# Patient Record
Sex: Male | Born: 1990 | Race: White | Hispanic: No | Marital: Single | Smoking: Never smoker
Health system: Southern US, Community
[De-identification: ages and names within clinical notes are randomized; demographics above are authoritative.]

## PROBLEM LIST (undated history)

## (undated) DIAGNOSIS — K900 Celiac disease: Secondary | ICD-10-CM

## (undated) HISTORY — DX: Celiac disease: K90.0

## (undated) HISTORY — PX: WISDOM TOOTH EXTRACTION: SHX21

---

## 2019-08-23 ENCOUNTER — Encounter: Payer: Self-pay | Admitting: Internal Medicine

## 2019-09-03 ENCOUNTER — Encounter: Payer: Self-pay | Admitting: Gastroenterology

## 2019-09-03 NOTE — Progress Notes (Signed)
Referring Provider: Elfredia Nevins, MD Primary Care Physician:  Elfredia Nevins, MD Primary Gastroenterologist:  Dr. Jena Gauss  Chief Complaint  Patient presents with  . Abdominal Pain    usually right side, occ left side; on/off since October; mostly after eating  . Nausea    occ    HPI:   Hunter Walters is a 29 y.o. male presenting today at the request of Elfredia Nevins, MD for abdominal pain.   Reviewed outside abdominal ultrasound completed on 04/29/2019: Gallbladder without gallstones or wall thickening.  No Murphy sign noted.  Liver within normal limits.  Pancreas and spleen within normal limits.  Kidneys appear normal.  Today: Intermittent. Usually after a meal.  States there is more of a chance of pain occurring if he has [place], but it also occurs with bland meals at times.  On rare occasion there is no cause of pain. Pain is in RUQ. Started in August 2020. Saw different provider in October for follow-up and was started on fiber, vitamin D, and apple cider vinegar.  Thought this was helping initially but now symptoms are occurring more often and more sharp. When it first started in August, it was dull. Typically occurs once a day.Overall pain isn't going to stop him from doing anyting.  Not associated with movement.  Continues to exercise regularly without trouble.  Pain typically last 30 min to 1 hour. No radiation. Somewhat between ribs. Rarely will have LUQ pain.  Occasional nausea associated with the pain. No vomiting.   Heartburn/acid reflux rarely. No dysphagia. No NSAIDs.  BMs daily. Especially since starting fiber. No constipation. No diarrhea. No blood in the stool. No black stools.  No fever, chills, lightheadedness, dizziness, pre-syncope, or syncope. No chest pain or heart palpitations, SOB, or cough.  No nasal congestion, sore throat, or other cold or flulike symptoms.  History of celiac disease.  Diagnosed at age 61.  Avoiding all gluten.  Celiac disease runs in his  family.  EGD at 29 y.o. to diagnose celiac disease.  No other endoscopic procedures.  Past Medical History:  Diagnosis Date  . Celiac disease     Past Surgical History:  Procedure Laterality Date  . WISDOM TOOTH EXTRACTION      Current Outpatient Medications  Medication Sig Dispense Refill  . APPLE CIDER VINEGAR PO Take by mouth. Takes 1 tablespoon 2-3 times per week    . Calcium Polycarbophil (FIBER-CAPS PO) Take 2 capsules by mouth daily.    . Cholecalciferol (VITAMIN D3) 125 MCG (5000 UT) TABS Take 1 tablet by mouth daily.     No current facility-administered medications for this visit.    Allergies as of 09/05/2019 - Review Complete 09/05/2019  Allergen Reaction Noted  . Gluten meal  09/05/2019  . Sulfa antibiotics  09/05/2019    Family History  Problem Relation Age of Onset  . Colon cancer Neg Hx     Social History   Socioeconomic History  . Marital status: Single    Spouse name: Not on file  . Number of children: Not on file  . Years of education: Not on file  . Highest education level: Not on file  Occupational History  . Not on file  Tobacco Use  . Smoking status: Never Smoker  . Smokeless tobacco: Never Used  Substance and Sexual Activity  . Alcohol use: Yes    Comment: 2-3 drinks once or twice weekly  . Drug use: Not Currently  . Sexual activity: Not on file  Other  Topics Concern  . Not on file  Social History Narrative  . Not on file   Social Determinants of Health   Financial Resource Strain:   . Difficulty of Paying Living Expenses: Not on file  Food Insecurity:   . Worried About Charity fundraiser in the Last Year: Not on file  . Ran Out of Food in the Last Year: Not on file  Transportation Needs:   . Lack of Transportation (Medical): Not on file  . Lack of Transportation (Non-Medical): Not on file  Physical Activity:   . Days of Exercise per Week: Not on file  . Minutes of Exercise per Session: Not on file  Stress:   . Feeling of  Stress : Not on file  Social Connections:   . Frequency of Communication with Friends and Family: Not on file  . Frequency of Social Gatherings with Friends and Family: Not on file  . Attends Religious Services: Not on file  . Active Member of Clubs or Organizations: Not on file  . Attends Archivist Meetings: Not on file  . Marital Status: Not on file  Intimate Partner Violence:   . Fear of Current or Ex-Partner: Not on file  . Emotionally Abused: Not on file  . Physically Abused: Not on file  . Sexually Abused: Not on file    Review of Systems: Gen: See HPI HEENT: See HPI CV: See HPI Resp: See HPI GI: See HPI GU : Denies urinary burning, urinary frequency, urinary hesitancy MS: Denies joint pain Derm: Denies rash Psych: Denies depression or anxiety Heme: Denies bruising or bleeding  Physical Exam: BP (!) 166/67   Pulse (!) 51   Temp (!) 97.1 F (36.2 C) (Oral)   Ht 6\' 2"  (1.88 m)   Wt 212 lb 9.6 oz (96.4 kg)   BMI 27.30 kg/m  General:   Alert and oriented. Pleasant and cooperative. Well-nourished and well-developed.  Head:  Normocephalic and atraumatic. Eyes:  Without icterus, sclera clear and conjunctiva pink.  Ears:  Normal auditory acuity. Lungs:  Clear to auscultation bilaterally. No wheezes, rales, or rhonchi. No distress.  Heart:  S1, S2 present without murmurs appreciated.  Abdomen:  +BS, soft, non-tender and non-distended. No HSM noted. No guarding or rebound. No masses appreciated.  Rectal:  Deferred  Msk:  Symmetrical without gross deformities. Normal posture. Extremities:  Without edema. Neurologic:  Alert and  oriented x4;  grossly normal neurologically. Skin:  Intact without significant lesions or rashes. Psych:  Normal mood and affect.

## 2019-09-05 ENCOUNTER — Encounter: Payer: Self-pay | Admitting: Gastroenterology

## 2019-09-05 ENCOUNTER — Other Ambulatory Visit: Payer: Self-pay

## 2019-09-05 ENCOUNTER — Ambulatory Visit: Payer: BC Managed Care – PPO | Admitting: Gastroenterology

## 2019-09-05 ENCOUNTER — Other Ambulatory Visit: Payer: Self-pay | Admitting: Gastroenterology

## 2019-09-05 ENCOUNTER — Telehealth: Payer: Self-pay | Admitting: *Deleted

## 2019-09-05 DIAGNOSIS — R109 Unspecified abdominal pain: Secondary | ICD-10-CM | POA: Insufficient documentation

## 2019-09-05 DIAGNOSIS — R1031 Right lower quadrant pain: Secondary | ICD-10-CM

## 2019-09-05 NOTE — Patient Instructions (Signed)
I would like to go ahead and update your labs since we do not have any recent labs on file.  Please also complete the HIDA scan.  You should receive a call with the date and time for this procedure.  This is to evaluate the functionality of her gallbladder.  We will call you with your results and further recommendations.  If you have any questions in the meantime, do not hesitate to call.  Ermalinda Memos, PA-C [facility]

## 2019-09-05 NOTE — Assessment & Plan Note (Addendum)
29 year old male with past medical history significant for celiac disease diagnosed at 29 years old following a strict gluten-free diet who presents for intermittent postprandial right upper quadrant pain since August 2020.  Symptoms have become more frequent and pain is more sharp in character but overall does not limit his daily activities.  Last about 30 minutes to 1 hour.  Otherwise, he is without pain.  Occasionally associated with nausea but no vomiting.  Rare reflux symptoms.  No NSAIDs.  No recent labs on file.  Abdominal ultrasound completed on 04/29/2019.  Gallbladder was without gallstones or wall thickening.  No Murphy sign noted.  Liver within normal limits.  Pancreas, spleen, kidneys appear normal.  Abdominal exam is benign today.  Query whether patient may have biliary dyskinesia. We will pursue HIDA scan and update labs including CMP and CBC at this time.  Further recommendations to follow.   Addendum: HIDA within normal limits. Sonographer documented patient reported symptoms with ensure ingestion. Patient states this was discomfort related to sitting for 2 hours during the exam. Nothing similar to his typical symptoms. Started Protonix 40 mg daily, patient was avoiding fried/fatty/spicy foods, and stopped alcohol. 2 week progress report with no significant change in symptoms. Due to ongoing RUQ pain, nausea without vomiting, and history of celiac disease, we will pursue EGD with propofol with Dr. Jena Gauss in the near future. The risks, benefits, and alternatives have been discussed in detail with patient. They have stated understanding and desire to proceed.  Follow-up after procedure.

## 2019-09-05 NOTE — Telephone Encounter (Signed)
LMOVM for pt.   HIDA scheduled for 1/20 at 8:00am, arrival 7:45am, npo midnight, no pain medications after midnight

## 2019-09-05 NOTE — Progress Notes (Signed)
CC'ED TO PCP 

## 2019-09-06 ENCOUNTER — Telehealth: Payer: Self-pay

## 2019-09-06 LAB — CBC WITH DIFFERENTIAL/PLATELET
Basophils Absolute: 0.1 10*3/uL (ref 0.0–0.2)
Basos: 1 %
EOS (ABSOLUTE): 0.2 10*3/uL (ref 0.0–0.4)
Eos: 3 %
Hematocrit: 47.7 % (ref 37.5–51.0)
Hemoglobin: 16.2 g/dL (ref 13.0–17.7)
Immature Grans (Abs): 0 10*3/uL (ref 0.0–0.1)
Immature Granulocytes: 0 %
Lymphocytes Absolute: 2 10*3/uL (ref 0.7–3.1)
Lymphs: 30 %
MCH: 29.3 pg (ref 26.6–33.0)
MCHC: 34 g/dL (ref 31.5–35.7)
MCV: 86 fL (ref 79–97)
Monocytes Absolute: 0.6 10*3/uL (ref 0.1–0.9)
Monocytes: 9 %
Neutrophils Absolute: 3.9 10*3/uL (ref 1.4–7.0)
Neutrophils: 57 %
Platelets: 336 10*3/uL (ref 150–450)
RBC: 5.53 x10E6/uL (ref 4.14–5.80)
RDW: 12.4 % (ref 11.6–15.4)
WBC: 6.7 10*3/uL (ref 3.4–10.8)

## 2019-09-06 LAB — COMPREHENSIVE METABOLIC PANEL
ALT: 18 IU/L (ref 0–44)
AST: 19 IU/L (ref 0–40)
Albumin/Globulin Ratio: 1.8 (ref 1.2–2.2)
Albumin: 4.9 g/dL (ref 4.1–5.2)
Alkaline Phosphatase: 70 IU/L (ref 39–117)
BUN/Creatinine Ratio: 13 (ref 9–20)
BUN: 12 mg/dL (ref 6–20)
Bilirubin Total: 0.4 mg/dL (ref 0.0–1.2)
CO2: 25 mmol/L (ref 20–29)
Calcium: 9.9 mg/dL (ref 8.7–10.2)
Chloride: 101 mmol/L (ref 96–106)
Creatinine, Ser: 0.92 mg/dL (ref 0.76–1.27)
GFR calc Af Amer: 132 mL/min/{1.73_m2} (ref >59)
GFR calc non Af Amer: 114 mL/min/{1.73_m2} (ref >59)
Globulin, Total: 2.7 g/dL (ref 1.5–4.5)
Glucose: 83 mg/dL (ref 65–99)
Potassium: 4.3 mmol/L (ref 3.5–5.2)
Sodium: 140 mmol/L (ref 134–144)
Total Protein: 7.6 g/dL (ref 6.0–8.5)

## 2019-09-06 NOTE — Telephone Encounter (Signed)
Spoke with pt. Pt notified of results.  

## 2019-09-06 NOTE — Telephone Encounter (Signed)
Received and reviewed labs dated 09/06/19. CBC: WBC 6.7, hemoglobin 162, platelets 336 CMP: Glucose 83, creatinine 0.92, sodium 140, potassium 4.3, calcium 9.9, albumin 4.9, bilirubin 0.4, alk phos 70, AST 19, ALT 18.   Will have labs scanned in.   Doris please let patient know his labs were all normal. Will follow-up on HIDA once this is completed.

## 2019-09-06 NOTE — Telephone Encounter (Signed)
Received lab results on CBC and CMP from [facility]. ( Orders were put in for [facility])  No abnormal results.  Placing on desk for review by Ermalinda Memos, PA.

## 2019-09-06 NOTE — Telephone Encounter (Signed)
LMOVM for pt 

## 2019-09-06 NOTE — Telephone Encounter (Signed)
Called pt and left detailed message regarding HIDA scan details.

## 2019-09-06 NOTE — Progress Notes (Signed)
CBC, CMP within normal limits

## 2019-09-14 ENCOUNTER — Other Ambulatory Visit: Payer: Self-pay

## 2019-09-14 ENCOUNTER — Encounter (HOSPITAL_COMMUNITY)
Admission: RE | Admit: 2019-09-14 | Discharge: 2019-09-14 | Disposition: A | Discharge status: Home / Self Care | Payer: BC Managed Care – PPO | Source: Ambulatory Visit | Attending: Gastroenterology | Admitting: Gastroenterology

## 2019-09-14 DIAGNOSIS — R1031 Right lower quadrant pain: Secondary | ICD-10-CM | POA: Diagnosis present

## 2019-09-14 MED ORDER — TECHNETIUM TC 99M MEBROFENIN IV KIT
5.0000 | PACK | Freq: Once | INTRAVENOUS | Status: AC | PRN
  Administered 2019-09-14: 5.34 via INTRAVENOUS

## 2019-09-16 NOTE — Progress Notes (Signed)
Tried to call patient to discuss results.  Gallbladder function is normal.  Report states patient did have mild abdominal pain following ensure ingestion.Helmut Muster, please let patient know results.  Please ask patient if the abdominal pain he had following ensure ingestion is the same RUQ abdominal pain he has been feeling after meals.

## 2019-09-20 NOTE — Progress Notes (Signed)
Noted. As prior US was normal and HIDA essentially normal except for symptoms with ensure ingestion that were not the same as his typical symptoms, I would like to start him on Protonix 40 mg daily, 30 minutes before breakfast, to empirically treat for possible gastritis/duodenitis (inflammation in his stomach or first part of his small intestine). I would like for him to call in 2 weeks with a progress report. If he doesn't have any improvement, would likely need EGD for further evaluation.   He should also continue to avoid all NSAIDs including ibuprofen, aleve, advil, naproxen, and goody powders. He should also stop alcohol consumption for now to see if this helps with his pain.   Please let me know if he is agreeable to starting Protonix and I will send in a prescription.

## 2019-09-21 ENCOUNTER — Other Ambulatory Visit: Payer: Self-pay | Admitting: Gastroenterology

## 2019-09-21 MED ORDER — PANTOPRAZOLE SODIUM 40 MG PO TBEC: 40.0000 mg | DELAYED_RELEASE_TABLET | Freq: Every day | ORAL | 3 refills | Status: DC

## 2019-10-06 ENCOUNTER — Telehealth: Payer: Self-pay | Admitting: Internal Medicine

## 2019-10-06 NOTE — Telephone Encounter (Signed)
PATIENT CALLED AND SAID THAT THE SAMPLES HE WAS GIVEN DID NOT WORK, ABD PAIN DID NOT STOP  [phone]  HE WILL BE AVAILABLE AFTER 3:30 WHEN SCHOOL IS OUT

## 2019-10-06 NOTE — Telephone Encounter (Addendum)
Spoke with pt. Pt is taking the Pantoprazole 40 mg qam 30 mins before eating breakfast. Pt is still having rt side abdominal pain between his rib cage. Pt states that the pain can be a level 1 where you barely notice it or a 5-6. Pain isn't constant or very bothersome per pt. Pt has noticed some nausea after lunch or dinner for the last 2- 3 days. No vomiting to report. Pt is avoiding oils and spicy foods and doesn't feel like the foods he's eating is causing his symptoms. Pt is having regular bowel movements daily without constipation and is taking daily fiber to help stay regular.

## 2019-10-06 NOTE — Telephone Encounter (Signed)
Will call pt after 3:30 PM

## 2019-10-07 NOTE — Telephone Encounter (Signed)
Called pt, EGD w/Prop w/RMR scheduled for 11/28/19 at 10:30am. Orders entered.

## 2019-10-07 NOTE — Telephone Encounter (Signed)
Pre-op and COVID test 11/24/19. Letter mailed with procedure instructions. 

## 2019-10-07 NOTE — Telephone Encounter (Signed)
Noted. Spoke with patient. Continues to have intermittent RUQ pain. Typically within an hour of eating but states he is avoiding fried/fatty foods as well as gluten as he has known celiac disease. Has also eliminated alcohol. Intermittent nausea without vomiting. Discussed reported symptoms with HIDA and patient states this was nothing like the pain he is experiencing. Didn't think it had anything to do with the ensure ingestion. Feels it was more discomfort related to sitting for the exam for 2 hours.   As he has continued to have RUQ pain with known celiac disease, I feel it is best to pursue EGD for further evaluation. Discussed this with patient and he is agreeable to proceed. Discussed risk and benefits as well.   [facility] Clinical Pool: Please arrange EGD with propofol with Dr. Jena Gauss. Dx: RUQ pain, nausea without vomiting, history of celiac disease.

## 2019-10-30 ENCOUNTER — Ambulatory Visit: Payer: BC Managed Care – PPO

## 2019-11-24 ENCOUNTER — Other Ambulatory Visit (HOSPITAL_COMMUNITY)
Admission: RE | Admit: 2019-11-24 | Discharge: 2019-11-24 | Disposition: A | Discharge status: Home / Self Care | Payer: BC Managed Care – PPO | Source: Ambulatory Visit | Attending: Internal Medicine | Admitting: Internal Medicine

## 2019-11-24 ENCOUNTER — Encounter (HOSPITAL_COMMUNITY): Payer: Self-pay

## 2019-11-24 ENCOUNTER — Encounter (HOSPITAL_COMMUNITY)
Admission: RE | Admit: 2019-11-24 | Discharge: 2019-11-24 | Disposition: A | Discharge status: Home / Self Care | Payer: BC Managed Care – PPO | Source: Ambulatory Visit | Attending: Internal Medicine | Admitting: Internal Medicine

## 2019-11-24 ENCOUNTER — Other Ambulatory Visit: Payer: Self-pay

## 2019-11-28 ENCOUNTER — Encounter (HOSPITAL_COMMUNITY)
Admission: RE | Disposition: A | Discharge status: Home / Self Care | Payer: Self-pay | Source: Home / Self Care | Attending: Internal Medicine

## 2019-11-28 ENCOUNTER — Ambulatory Visit (HOSPITAL_COMMUNITY)
Admission: RE | Admit: 2019-11-28 | Discharge: 2019-11-28 | Disposition: A | Discharge status: Home / Self Care | Payer: BC Managed Care – PPO | Attending: Internal Medicine | Admitting: Internal Medicine

## 2019-11-28 ENCOUNTER — Encounter (HOSPITAL_COMMUNITY): Payer: Self-pay | Admitting: Internal Medicine

## 2019-11-28 ENCOUNTER — Ambulatory Visit (HOSPITAL_COMMUNITY): Payer: BC Managed Care – PPO | Admitting: Anesthesiology

## 2019-11-28 DIAGNOSIS — Z8719 Personal history of other diseases of the digestive system: Secondary | ICD-10-CM | POA: Diagnosis not present

## 2019-11-28 DIAGNOSIS — Z79899 Other long term (current) drug therapy: Secondary | ICD-10-CM | POA: Diagnosis not present

## 2019-11-28 DIAGNOSIS — R1011 Right upper quadrant pain: Secondary | ICD-10-CM | POA: Insufficient documentation

## 2019-11-28 DIAGNOSIS — K3189 Other diseases of stomach and duodenum: Secondary | ICD-10-CM | POA: Insufficient documentation

## 2019-11-28 HISTORY — PX: ESOPHAGOGASTRODUODENOSCOPY (EGD) WITH PROPOFOL: SHX5813

## 2019-11-28 HISTORY — PX: BIOPSY: SHX5522

## 2019-11-28 SURGERY — ESOPHAGOGASTRODUODENOSCOPY (EGD) WITH PROPOFOL
Anesthesia: General

## 2019-11-28 MED ORDER — PROPOFOL 10 MG/ML IV BOLUS
INTRAVENOUS | Status: DC | PRN
  Administered 2019-11-28 (×2): 40 ug via INTRAVENOUS

## 2019-11-28 MED ORDER — CHLORHEXIDINE GLUCONATE CLOTH 2 % EX PADS: 6.0000 | MEDICATED_PAD | Freq: Once | CUTANEOUS | Status: DC

## 2019-11-28 MED ORDER — PROPOFOL 500 MG/50ML IV EMUL
INTRAVENOUS | Status: DC | PRN
  Administered 2019-11-28: 150 ug/kg/min via INTRAVENOUS

## 2019-11-28 MED ORDER — STERILE WATER FOR IRRIGATION IR SOLN
Status: DC | PRN
  Administered 2019-11-28: 100 mL

## 2019-11-28 MED ORDER — MIDAZOLAM HCL 2 MG/2ML IJ SOLN
INTRAMUSCULAR | Status: DC | PRN
  Administered 2019-11-28: 2 mg via INTRAVENOUS

## 2019-11-28 MED ORDER — MIDAZOLAM HCL 2 MG/2ML IJ SOLN
INTRAMUSCULAR | Status: AC
  Filled 2019-11-28: qty 2

## 2019-11-28 MED ORDER — PROPOFOL 10 MG/ML IV BOLUS
INTRAVENOUS | Status: AC
  Filled 2019-11-28: qty 40

## 2019-11-28 MED ORDER — LACTATED RINGERS IV SOLN: Freq: Once | INTRAVENOUS | Status: AC

## 2019-11-28 MED ORDER — LACTATED RINGERS IV SOLN: INTRAVENOUS | Status: DC | PRN

## 2019-11-28 NOTE — Discharge Instructions (Signed)
EGD Discharge instructions Please read the instructions outlined below and refer to this sheet in the next few weeks. These discharge instructions provide you with general information on caring for yourself after you leave the hospital. Your doctor may also give you specific instructions. While your treatment has been planned according to the most current medical practices available, unavoidable complications occasionally occur. If you have any problems or questions after discharge, please call your doctor. ACTIVITY  You may resume your regular activity but move at a slower pace for the next 24 hours.   Take frequent rest periods for the next 24 hours.   Walking will help expel (get rid of) the air and reduce the bloated feeling in your abdomen.   No driving for 24 hours (because of the anesthesia (medicine) used during the test).   You may shower.   Do not sign any important legal documents or operate any machinery for 24 hours (because of the anesthesia used during the test).  NUTRITION  Drink plenty of fluids.   You may resume your normal diet.   Begin with a light meal and progress to your normal diet.   Avoid alcoholic beverages for 24 hours or as instructed by your caregiver.  MEDICATIONS  You may resume your normal medications unless your caregiver tells you otherwise.  WHAT YOU CAN EXPECT TODAY  You may experience abdominal discomfort such as a feeling of fullness or "gas" pains.  FOLLOW-UP  Your doctor will discuss the results of your test with you.  SEEK IMMEDIATE MEDICAL ATTENTION IF ANY OF THE FOLLOWING OCCUR:  Excessive nausea (feeling sick to your stomach) and/or vomiting.   Severe abdominal pain and distention (swelling).   Trouble swallowing.   Temperature over 101 F (37.8 C).   Rectal bleeding or vomiting of blood.    Small bowel appeared abnormal.  Not unexpected with celiac disease.  Biopsies were performed.  Your stomach and esophagus appeared  normal  Further recommendations to follow pending review of pathology report  At patient request, I called Shanda Bumps at [phone] and reviewed results

## 2019-11-28 NOTE — H&P (Signed)
@LOGO @   Primary Care Physician:  , MD Primary Gastroenterologist:  Dr. Elfredia Nevins  Pre-Procedure History & Physical: HPI:  Hunter Walters is a 29 y.o. male here for further evaluation of right upper quadrant pain.  No difference with Protonix 40 mg daily no dysphagia.  For diagnostic EGD.  History of celiac disease.  Past Medical History:  Diagnosis Date  . Celiac disease     Past Surgical History:  Procedure Laterality Date  . WISDOM TOOTH EXTRACTION      Prior to Admission medications   Medication Sig Start Date End Date Taking? Authorizing Provider  Calcium Polycarbophil (FIBER-CAPS PO) Take 2 capsules by mouth daily.   Yes [provider]  Cholecalciferol (VITAMIN D3) 125 MCG (5000 UT) TABS Take 5,000 Units by mouth daily.    Yes [provider]  pantoprazole (PROTONIX) 40 MG tablet Take 1 tablet (40 mg total) by mouth daily before breakfast. 09/21/19 09/20/20 Yes 09/22/20, PA-C    Allergies as of 10/07/2019 - Review Complete 09/05/2019  Allergen Reaction Noted  . Gluten meal  09/05/2019  . Sulfa antibiotics  09/05/2019    Family History  Problem Relation Age of Onset  . Colon cancer Neg Hx     Social History   Socioeconomic History  . Marital status: Single    Spouse name: Not on file  . Number of children: Not on file  . Years of education: Not on file  . Highest education level: Not on file  Occupational History  . Not on file  Tobacco Use  . Smoking status: Never Smoker  . Smokeless tobacco: Never Used  Substance and Sexual Activity  . Alcohol use: Yes    Comment: 2-3 drinks once or twice weekly  . Drug use: Not Currently  . Sexual activity: Not on file  Other Topics Concern  . Not on file  Social History Narrative  . Not on file   Social Determinants of Health   Financial Resource Strain:   . Difficulty of Paying Living Expenses:   Food Insecurity:   . Worried About 11/03/2019 in the Last Year:   .  Programme researcher, broadcasting/film/video in the Last Year:   Transportation Needs:   . Barista (Medical):   Freight forwarder Lack of Transportation (Non-Medical):   Physical Activity:   . Days of Exercise per Week:   . Minutes of Exercise per Session:   Stress:   . Feeling of Stress :   Social Connections:   . Frequency of Communication with Friends and Family:   . Frequency of Social Gatherings with Friends and Family:   . Attends Religious Services:   . Active Member of Clubs or Organizations:   . Attends Marland Kitchen Meetings:   Banker Marital Status:   Intimate Partner Violence:   . Fear of Current or Ex-Partner:   . Emotionally Abused:   [employer]land [employer] Physically Abused:   . Sexually Abused:     Review of Systems: See HPI, otherwise negative ROS  Physical Exam: BP 127/78   Pulse 70   Temp 98 F (36.7 C) (Oral)   Resp 20   Ht 6\' 3"  (1.905 m)   Wt 88.5 kg   SpO2 99%   BMI 24.37 kg/m  General:   Alert,  Well-developed, well-nourished, pleasant and cooperative in NAD  Mouth:  No deformity or lesions. Neck:  Supple; no masses or thyromegaly. No significant cervical adenopathy. Lungs:  Clear throughout to auscultation.  No wheezes, crackles, or rhonchi. No acute distress. Heart:  Regular rate and rhythm; no murmurs, clicks, rubs,  or gallops. Abdomen: Non-distended, normal bowel sounds.  Soft and nontender without appreciable mass or hepatosplenomegaly.  Pulses:  Normal pulses noted. Extremities:  Without clubbing or edema.  Impression/Plan:  29 year old male with celiac disease right upper quadrant abdominal pain.  Gallbladder work-up negative.  No dysphagia.  Here for diagnostic EGD.  The risks, benefits, limitations, alternatives and imponderables have been reviewed with the patient. Potential for esophageal dilation, biopsy, etc. have also been reviewed.  Questions have been answered. All parties agreeable.     Notice: This dictation was prepared with Dragon dictation along with smaller phrase  technology. Any transcriptional errors that result from this process are unintentional and may not be corrected upon review.

## 2019-11-28 NOTE — Op Note (Signed)
[facility] Patient Name: Hunter Walters Procedure Date: 11/28/2019 10:52 AM MRN: [id number] Date of Birth: [birth year] Attending MD: Norvel Richards , MD CSN: [id number] Age: 29 Admit Type: Outpatient Procedure:                Upper GI endoscopy Indications:              Abdominal pain in the right upper quadrant Providers:                Norvel Richards, MD, Rosina Lowenstein, RN, Raphael Gibney, Technician Referring MD:              Medicines:                Propofol per Anesthesia Complications:            No immediate complications. Estimated Blood Loss:     Estimated blood loss was minimal. Procedure:                Pre-Anesthesia Assessment:                           - Prior to the procedure, a History and Physical                            was performed, and patient medications and                            allergies were reviewed. The patient's tolerance of                            previous anesthesia was also reviewed. The risks                            and benefits of the procedure and the sedation                            options and risks were discussed with the patient.                            All questions were answered, and informed consent                            was obtained. Prior Anticoagulants: The patient has                            taken no previous anticoagulant or antiplatelet                            agents. ASA Grade Assessment: II - A patient with                            mild systemic disease. After reviewing the risks  and benefits, the patient was deemed in                            satisfactory condition to undergo the procedure.                           After obtaining informed consent, the endoscope was                            passed under direct vision. Throughout the                            procedure, the patient's blood pressure, pulse, and       oxygen saturations were monitored continuously. The                            [id number]-[id number] ([id number]) scope was introduced through the                            mouth, and advanced to the second part of duodenum.                            The upper GI endoscopy was accomplished without                            difficulty. The patient tolerated the procedure                            well. Scope In: 11:05:37 AM Scope Out: 11:11:23 AM Total Procedure Duration: 0 hours 5 minutes 46 seconds  Findings:      The examined esophagus was normal.      The entire examined stomach was normal.      Abnormal appearing bulb second and third portion of the duodenum with       denuded appearing mucosa. No scalloping seen. No ulcer. No mass.       Multiple biopsies taken Impression:               - Normal esophagus.                           - Normal stomach.                           - Mucosal changes in the duodenum. Abnormal                            appearing mucosa of uncertain significance?"in the                            setting of celiac disease?"status post biopsy                           Further recommendations to follow pending review of  pathology report Moderate Sedation:      Moderate (conscious) sedation was personally administered by an       anesthesia professional. The following parameters were monitored: oxygen       saturation, heart rate, blood pressure, respiratory rate, EKG, adequacy       of pulmonary ventilation, and response to care. Recommendation:            Procedure Code(s):        --- Professional ---                           [phone], Esophagogastroduodenoscopy, flexible,                            transoral; diagnostic, including collection of                            specimen(s) by brushing or washing, when performed                            (separate procedure) Diagnosis Code(s):        --- Professional ---                            K31.89, Other diseases of stomach and duodenum                           R10.11, Right upper quadrant pain CPT copyright 2019 American Medical Association. All rights reserved. The codes documented in this report are preliminary and upon coder review may  be revised to meet current compliance requirements. Gerrit Friends. Dawon Troop, MD Gennette Pac, MD 11/28/2019 11:20:17 AM This report has been signed electronically. Number of Addenda: 0

## 2019-11-28 NOTE — Transfer of Care (Signed)
Immediate Anesthesia Transfer of Care Note  Patient: Hunter Walters [place]  Procedure(s) Performed: ESOPHAGOGASTRODUODENOSCOPY (EGD) WITH PROPOFOL (N/A ) BIOPSY  Patient Location: PACU  Anesthesia Type:General  Level of Consciousness: awake  Airway & Oxygen Therapy: Patient Spontanous Breathing  Post-op Assessment: Report given to RN  Post vital signs: Reviewed and stable  Last Vitals:  Vitals Value Taken Time  BP    Temp    Pulse    Resp    SpO2      Last Pain:  Vitals:   11/28/19 1114  TempSrc:   PainSc: 0-No pain         Complications: No apparent anesthesia complications

## 2019-11-28 NOTE — Anesthesia Postprocedure Evaluation (Signed)
Anesthesia Post Note  Patient: Hunter Walters  Procedure(s) Performed: ESOPHAGOGASTRODUODENOSCOPY (EGD) WITH PROPOFOL (N/A ) BIOPSY  Patient location during evaluation: PACU Anesthesia Type: General Level of consciousness: awake and alert and oriented Pain management: pain level controlled Vital Signs Assessment: post-procedure vital signs reviewed and stable Respiratory status: spontaneous breathing Cardiovascular status: blood pressure returned to baseline and stable Postop Assessment: no apparent nausea or vomiting Anesthetic complications: no     Last Vitals:  Vitals:   11/28/19 0938  BP: 127/78  Pulse: 70  Resp: 20  Temp: 36.7 C  SpO2: 99%    Last Pain:  Vitals:   11/28/19 1114  TempSrc:   PainSc: 0-No pain                 Shoni Quijas

## 2019-11-28 NOTE — Anesthesia Preprocedure Evaluation (Signed)
Anesthesia Evaluation  Patient identified by MRN, date of birth, ID band Patient awake    Reviewed: Allergy & Precautions, NPO status , Patient's Chart, lab work & pertinent test results  Airway Mallampati: I  TM Distance: >3 FB Neck ROM: Full    Dental  (+) Dental Advisory Given, Teeth Intact   Pulmonary neg pulmonary ROS,    Pulmonary exam normal breath sounds clear to auscultation       Cardiovascular Exercise Tolerance: Good  Rhythm:Regular Rate:Normal     Neuro/Psych negative neurological ROS  negative psych ROS   GI/Hepatic (+)     substance abuse  alcohol use, Celiac disease   Endo/Other  negative endocrine ROS  Renal/GU negative Renal ROS  negative genitourinary   Musculoskeletal negative musculoskeletal ROS (+)   Abdominal   Peds negative pediatric ROS (+)  Hematology negative hematology ROS (+)   Anesthesia Other Findings   Reproductive/Obstetrics negative OB ROS                             Anesthesia Physical Anesthesia Plan  ASA: I  Anesthesia Plan: General   Post-op Pain Management:    Induction: Intravenous  PONV Risk Score and Plan: 2 and Treatment may vary due to age or medical condition  Airway Management Planned: Nasal Cannula, Natural Airway and Simple Face Mask  Additional Equipment:   Intra-op Plan:   Post-operative Plan:   Informed Consent: I have reviewed the patients History and Physical, chart, labs and discussed the procedure including the risks, benefits and alternatives for the proposed anesthesia with the patient or authorized representative who has indicated his/her understanding and acceptance.     Dental advisory given  Plan Discussed with: CRNA and Surgeon  Anesthesia Plan Comments:         Anesthesia Quick Evaluation

## 2019-11-29 LAB — SURGICAL PATHOLOGY

## 2019-12-03 ENCOUNTER — Encounter: Payer: Self-pay | Admitting: Internal Medicine

## 2019-12-05 ENCOUNTER — Telehealth: Payer: Self-pay

## 2019-12-05 DIAGNOSIS — R1011 Right upper quadrant pain: Secondary | ICD-10-CM

## 2019-12-05 NOTE — Telephone Encounter (Signed)
OV made and appt card mailed °

## 2019-12-05 NOTE — Telephone Encounter (Signed)
Agree with evaluating his small bowel. Dr. Jena Gauss, would you recommend given capsule or CT enterography?

## 2019-12-05 NOTE — Telephone Encounter (Signed)
Per RMR- Send letter to patient.  Send copy of letter with path to referring provider and PCP.   Need ov with [facility] in 3-4 weeks.   Does he need imaging of his entire small bowel to r/o complications of long standing celiac disease (tumor / Lymphoma ) as a cause of pain before circling back to possible GB etiology??

## 2019-12-06 NOTE — Telephone Encounter (Signed)
If not responded to previously, yes, Would Be Best If [employer] along with It.

## 2019-12-07 NOTE — Telephone Encounter (Signed)
Anyway for [place] to find out if insurance will cover CT enterography or [employer] and which would be covered better? Dx: history of celiac disease, recurrent RUQ pain without significant findings on EGD, r/o possible tumor/lymphoma.

## 2019-12-08 NOTE — Telephone Encounter (Signed)
Lmom, waiting on a return call.  

## 2019-12-08 NOTE — Telephone Encounter (Signed)
Noted. Please let patient know as his EGD was without any significant findings, we would like to evaluate his small bowel completely with a CT enterography to exclude any other causes of his RUQ pain if insurance will pay for it. If he is agreeable, please pursue PA.

## 2019-12-08 NOTE — Telephone Encounter (Signed)
Letter mailed to pt.  

## 2019-12-08 NOTE — Telephone Encounter (Signed)
A PA would have to be submitted to see if it would be approved for both procedures via patient insurance.

## 2019-12-09 NOTE — Telephone Encounter (Signed)
Pt returned call and is in agreement with the CT enterography if insurance will cover it.

## 2019-12-09 NOTE — Telephone Encounter (Signed)
Noted. [facility] Clinical pool, please proceed with PA for  CT Enterography.

## 2019-12-12 NOTE — Addendum Note (Signed)
Addended by: Armstead Peaks on: 12/12/2019 08:21 AM   Modules accepted: Orders

## 2019-12-12 NOTE — Telephone Encounter (Addendum)
PA approved for CT Entero. Auth# [id number] dates 12/12/2019-06/08/2020 via [employer].  CT entero scheduled for 12/29/19 at 3:00pm, arrival 2:00pm to start drinking contrast at radiology,  Npo 4 hours prior. LMOVM for pt. Letter mailed with appt information.

## 2019-12-22 ENCOUNTER — Other Ambulatory Visit: Payer: Self-pay | Admitting: Gastroenterology

## 2019-12-26 ENCOUNTER — Ambulatory Visit: Payer: BC Managed Care – PPO | Admitting: Gastroenterology

## 2019-12-29 ENCOUNTER — Ambulatory Visit (HOSPITAL_COMMUNITY): Payer: BC Managed Care – PPO

## 2020-01-13 ENCOUNTER — Ambulatory Visit: Payer: BC Managed Care – PPO | Attending: Internal Medicine

## 2020-01-13 DIAGNOSIS — Z23 Encounter for immunization: Secondary | ICD-10-CM

## 2020-01-13 NOTE — Progress Notes (Signed)
   Covid-19 Vaccination Clinic  Name:  Anddy Wingert    MRN: [id number] DOB: February 17, 1991  01/13/2020  Mr. Pinedo was observed post Covid-19 immunization for 15 minutes without incident. He was provided with Vaccine Information Sheet and instruction to access the V-Safe system.   Mr. Busche was instructed to call 911 with any severe reactions post vaccine: Marland Kitchen Difficulty breathing  . Swelling of face and throat  . A fast heartbeat  . A bad rash all over body  . Dizziness and weakness   Immunizations Administered    Name Date Dose VIS Date Route   Pfizer COVID-19 Vaccine 01/13/2020  3:38 PM 0.3 mL 10/19/2018 Intramuscular   Manufacturer: [employer], [employer]   Lot: [id number]   NDC: [address]-1

## 2020-02-01 ENCOUNTER — Other Ambulatory Visit: Payer: Self-pay

## 2020-02-01 ENCOUNTER — Ambulatory Visit (HOSPITAL_COMMUNITY)
Admission: RE | Admit: 2020-02-01 | Discharge: 2020-02-01 | Disposition: A | Discharge status: Home / Self Care | Payer: BC Managed Care – PPO | Source: Ambulatory Visit | Attending: Gastroenterology | Admitting: Gastroenterology

## 2020-02-01 DIAGNOSIS — R1011 Right upper quadrant pain: Secondary | ICD-10-CM | POA: Diagnosis not present

## 2020-02-01 MED ORDER — IOHEXOL 300 MG/ML  SOLN
150.0000 mL | Freq: Once | INTRAMUSCULAR | Status: AC | PRN
  Administered 2020-02-01: 125 mL via INTRAVENOUS

## 2020-02-03 ENCOUNTER — Ambulatory Visit: Payer: BC Managed Care – PPO | Attending: Internal Medicine

## 2020-02-03 ENCOUNTER — Ambulatory Visit: Payer: BC Managed Care – PPO

## 2020-02-03 DIAGNOSIS — Z23 Encounter for immunization: Secondary | ICD-10-CM

## 2020-02-03 NOTE — Progress Notes (Signed)
   Covid-19 Vaccination Clinic  Name:  Hunter Walters    MRN: [id number] DOB: 1991-05-03  02/03/2020  Hunter Walters was observed post Covid-19 immunization for 15 minutes without incident. He was provided with Vaccine Information Sheet and instruction to access the V-Safe system.   Hunter Walters was instructed to call 911 with any severe reactions post vaccine: Marland Kitchen Difficulty breathing  . Swelling of face and throat  . A fast heartbeat  . A bad rash all over body  . Dizziness and weakness   Immunizations Administered    Name Date Dose VIS Date Route   Pfizer COVID-19 Vaccine 02/03/2020 10:13 AM 0.3 mL 10/19/2018 Intramuscular   Manufacturer: [employer], [employer]   Lot: [id number]   NDC: [address]-1

## 2020-02-04 NOTE — Progress Notes (Signed)
Referring Provider: Redmond School, MD Primary Care Physician:  Redmond School, MD Primary GI Physician: Dr. Gala Romney  Chief Complaint  Patient presents with  . Follow-up    fu RUQ abd pain, seems better with wt loss, hx Covid 10/08/19    HPI:   Hunter Walters is a 29 y.o. male with history of celiac disease presenting today for follow-up of RUQ abdominal pain. Last seen in our office for the same in January 2021. He reported intermittent postprandial RUQ pain since August 2020 that had become more frequent and sharp lasting 30 minutes - 1 hour. Occasional nausea without vomiting. Rare reflux symptoms. No NSAIDs. Abdominal ultrasound in September 2020 without significant findings. His abdominal exam was benign. He was scheduled for HIDA scan and labs were updated.  CBC and CMP within normal limits. HIDA scan was within normal limits. Sonographer documented patient had symptoms with Ensure ingestion. I spoke with patient and he states this was discomfort related to sitting for 2 hours during the exam. Nothing similar to his typical symptoms. We started Protonix 40 mg daily and patient was avoiding fried/fatty/spicy foods and had stopped alcohol. 2-week progress report with no significant change and he was scheduled for EGD.  EGD April 2021 with normal esophagus, normal stomach, abnormal-appearing duodenum s/p multiple biopsies. Pathology with minimal increase in intraepithelial lymphocytes in the villous tips, no significant villous blunting, no dysplasia or malignancy. He was scheduled for CTE for complete evaluation of his small bowel to rule out any complication secondary to long-standing celiac disease.  CTE 02/01/20 with no abnormalities. Notified patient of results. He reported he had lost 20 pounds. He was wrestling at a local school and stated he has very little pain. States he has been very active and not having any problems. No longer needing Zofran.  Today:  Abdominal pain is much more  subtle. Pain had started to resolve prior to EGD. It is very light and will be gone in 5 minutes. No longer having sharp pain. Not sure if it is just cramps. May go 2 months without pain. Lost about 20 lbs since he has been wrestling. At his goal weight. Not taking Protonix. No GERD symptoms or dysphagia. No nausea or vomiting. BMs daily. No blood in the stool or black stool. Had [place] in February. Has just completed the vaccine series last Friday.   Will take ibuprofen as needed. Not regularly. Alcohol maybe 1-2 times a week. No change in symptoms since resuming occasional alcohol.   Past Medical History:  Diagnosis Date  . Celiac disease    diagnosed at age 79    Past Surgical History:  Procedure Laterality Date  . BIOPSY  11/28/2019   Procedure: BIOPSY;  Surgeon: Daneil Dolin, MD;  Location: [facility];  Service: Endoscopy;;  . ESOPHAGOGASTRODUODENOSCOPY (EGD) WITH PROPOFOL N/A 11/28/2019   Procedure: ESOPHAGOGASTRODUODENOSCOPY (EGD) WITH PROPOFOL;  Surgeon: Daneil Dolin, MD; normal esophagus, normal stomach, abnormal-appearing duodenum s/p multiple biopsies. Pathology with minimal increase in intraepithelial lymphocytes in the villous tips, no significant villous blunting, no dysplasia or malignancy.  . WISDOM TOOTH EXTRACTION      No current outpatient medications on file.   No current facility-administered medications for this visit.    Allergies as of 02/06/2020 - Review Complete 02/06/2020  Allergen Reaction Noted  . Gluten meal  09/05/2019  . Sulfa antibiotics  09/05/2019    Family History  Problem Relation Age of Onset  . Colon cancer Neg Hx  Social History   Socioeconomic History  . Marital status: Single    Spouse name: Not on file  . Number of children: Not on file  . Years of education: Not on file  . Highest education level: Not on file  Occupational History  . Not on file  Tobacco Use  . Smoking status: Never Smoker  . Smokeless tobacco: Never  Used  Substance and Sexual Activity  . Alcohol use: Yes    Comment: 2-3 drinks once or twice weekly  . Drug use: Not Currently  . Sexual activity: Not on file  Other Topics Concern  . Not on file  Social History Narrative  . Not on file   Social Determinants of Health   Financial Resource Strain:   . Difficulty of Paying Living Expenses:   Food Insecurity:   . Worried About Programme researcher, broadcasting/film/video in the Last Year:   . Barista in the Last Year:   Transportation Needs:   . Freight forwarder (Medical):   Marland Kitchen Lack of Transportation (Non-Medical):   Physical Activity:   . Days of Exercise per Week:   . Minutes of Exercise per Session:   Stress:   . Feeling of Stress :   Social Connections:   . Frequency of Communication with Friends and Family:   . Frequency of Social Gatherings with Friends and Family:   . Attends Religious Services:   . Active Member of Clubs or Organizations:   . Attends Banker Meetings:   Marland Kitchen Marital Status:     Review of Systems: Gen: Denies fever, chills, cold or flulike symptoms, lightheadedness, dizziness, presyncope, syncope CV: Denies chest pain or palpitations Resp: Denies dyspnea or cough GI: See HPI Derm: Denies rash Heme: See HPI  Physical Exam: BP (!) 143/72   Pulse 83   Temp (!) 96.8 F (36 C) (Temporal)   Ht 6\' 3"  (1.905 m)   Wt 189 lb 12.8 oz (86.1 kg)   BMI 23.72 kg/m  General:   Alert and oriented. No distress noted. Pleasant and cooperative.  Head:  Normocephalic and atraumatic. Eyes:  Conjuctiva clear without scleral icterus. Heart:  S1, S2 present without murmurs appreciated. Lungs:  Clear to auscultation bilaterally. No wheezes, rales, or rhonchi. No distress.  Abdomen:  +BS, soft, non-tender and non-distended. No rebound or guarding. No HSM or masses noted. Msk:  Symmetrical without gross deformities. Normal posture. Extremities:  Without edema. Neurologic:  Alert and  oriented x4 Psych:  Normal mood  and affect.

## 2020-02-06 ENCOUNTER — Other Ambulatory Visit: Payer: Self-pay

## 2020-02-06 ENCOUNTER — Ambulatory Visit: Payer: BC Managed Care – PPO | Admitting: Gastroenterology

## 2020-02-06 ENCOUNTER — Encounter: Payer: Self-pay | Admitting: Gastroenterology

## 2020-02-06 VITALS — BP 143/72 | HR 83 | Temp 96.8°F | Ht 75.0 in | Wt 189.8 lb

## 2020-02-06 DIAGNOSIS — R1011 Right upper quadrant pain: Secondary | ICD-10-CM | POA: Diagnosis not present

## 2020-02-06 NOTE — Assessment & Plan Note (Addendum)
29 year old male with medical history significant for celiac disease diagnosed at 29 years of age following a strict gluten-free diet who reported new onset intermittent postprandial right upper quadrant pain starting back in August 2020 that had become more frequent/sharp when I saw him in January 2021. Work-up initially included CBC and CMP within normal limits, HIDA scan within normal limits.  I started him on Protonix 40 mg daily empirically for possible gastritis.  He had no improvement or change in symptoms in 2 weeks so EGD was pursued.  EGD in April 2021 with normal esophagus, normal stomach, abnormal appearing duodenum s/p multiple biopsies.  Pathology with minimal increase in intraepithelial lymphocytes in the villous tips, no villous blunting.  He was scheduled for CT for complete evaluation/rule out complication secondary to longstanding celiac disease which was also normal.  At this time, abdominal pain has significantly improved/almost resolved.  Intentional weight loss of about 20 pounds since he has began wrestling and is now at his goal weight.  No longer taking Protonix and has not noticed any worsening since discontinuing this medication.  No other significant GI symptoms.  No BRBPR or melena.  Reports taking ibuprofen as needed.  Has resumed alcohol maybe 1-2 times a week.  Abdominal exam is benign.  It is possible patient had a mild gastritis/duodenitis that had improved/resolved by the time of EGD.  As symptoms have significantly improved and he has discontinued Protonix on his own, I am okay with him continuing to stay off of this.  Discussed the fact that NSAIDs and alcohol can be triggers for gastritis/duodenitis, and he was advised to limit these products.  We will plan to follow-up as needed.  Advised patient to call with any return of symptoms or other GI concerns.

## 2020-02-06 NOTE — Patient Instructions (Signed)
I am glad you are feeling much better!  I suspect that you may have had some mild inflammation in your stomach or first part of your small intestine that may have been contributing to your right-sided abdominal pain.  As your symptoms have significantly improved/almost resolved, I am okay with you holding Protonix.  Be mindful that alcohol and NSAID products including ibuprofen, Aleve, Advil, and Goody powders can contribute/cause gastritis (inflammation in your stomach). Continue to limit these products.   We will plan to see back as needed.  Do not hesitate to call if you have any return of abdominal pain or other GI concerns!  Ermalinda Memos, PA-C [facility]

## 2020-02-06 NOTE — Progress Notes (Signed)
Cc'ed to pcp °

## 2020-06-19 IMAGING — NM NM HEPATO W/GB/PHARM/[PERSON_NAME]
2 series · 12 of 12 positions shown · non-contrast
Comparison: None.

CLINICAL DATA: Postprandial RIGHT upper quadrant pain and nausea
for months, RIGHT lower quadrant pain

EXAM:
NUCLEAR MEDICINE HEPATOBILIARY IMAGING WITH GALLBLADDER EF
TECHNIQUE: Sequential images of the abdomen were obtained [DATE] minutes
following intravenous administration of radiopharmaceutical. After
oral ingestion of Ensure, gallbladder ejection fraction was
determined. At 60 min, normal ejection fraction is greater than 33%.
RADIOPHARMACEUTICALS:  5.34 mCi 2c-QQm  Choletec IV

[Series 1: biliary · 3.25mm/px · 6 of 60 frames shown]
[frame 6/60]
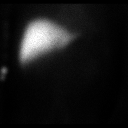
[frame 16/60]
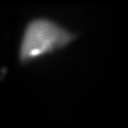
[frame 26/60]
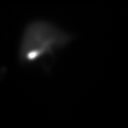
[frame 36/60]
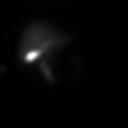
[frame 46/60]
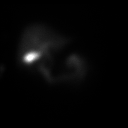
[frame 56/60]
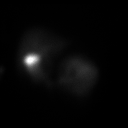

[Series 2: gbef · 3.25mm/px · 6 of 60 frames shown]
[frame 6/60]
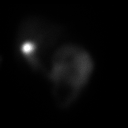
[frame 16/60]
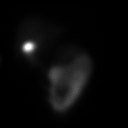
[frame 26/60]
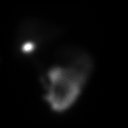
[frame 36/60]
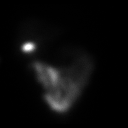
[frame 46/60]
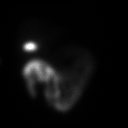
[frame 56/60]
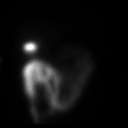

[12 of 12 positions shown; findings below may reference images not displayed]

FINDINGS: Normal tracer extraction from bloodstream indicating normal
hepatocellular function.

Normal excretion of tracer into biliary tree.

Gallbladder visualized at 14 min.

Small bowel visualized at 29 min.

No hepatic retention of tracer.

Subjectively normal emptying of tracer from gallbladder following
fatty meal stimulation.

Calculated gallbladder ejection fraction is 65%, normal.

Patient reported mild abdominal pain following Ensure ingestion.

Normal gallbladder ejection fraction following Ensure ingestion is
greater than 33% at 1 hour.
IMPRESSION: Patent biliary tree with normal gallbladder ejection fraction of 65%
following fatty meal stimulation.

Patient reported mild abdominal pain following Ensure ingestion.

## 2020-08-13 ENCOUNTER — Ambulatory Visit: Payer: BC Managed Care – PPO | Attending: Internal Medicine

## 2020-08-13 DIAGNOSIS — Z23 Encounter for immunization: Secondary | ICD-10-CM

## 2020-08-13 NOTE — Progress Notes (Signed)
   Covid-19 Vaccination Clinic  Name:  Hunter Walters    MRN: [id number] DOB: 07/16/1991  08/13/2020  Hunter Walters was observed post Covid-19 immunization for 15 minutes without incident. He was provided with Vaccine Information Sheet and instruction to access the V-Safe system.   Hunter Walters was instructed to call 911 with any severe reactions post vaccine: . Difficulty breathing  . Swelling of face and throat  . A fast heartbeat  . A bad rash all over body  . Dizziness and weakness   Immunizations Administered    Name Date Dose VIS Date Route   Pfizer COVID-19 Vaccine 08/13/2020  1:51 PM 0.3 mL 06/13/2020 Intramuscular   Manufacturer: Pfizer, Inc   Lot: [id number]   NDC: [address]-2     

## 2020-08-13 NOTE — Progress Notes (Signed)
   Covid-19 Vaccination Clinic  Name:  Hunter Walters    MRN: [id number] DOB: 1991/07/31  08/13/2020  [name] was observed post Covid-19 immunization for 15 minutes without incident. He was provided with Vaccine Information Sheet and instruction to access the V-Safe system.   [name] was instructed to call 911 with any severe reactions post vaccine: Marland Kitchen Difficulty breathing  . Swelling of face and throat  . A fast heartbeat  . A bad rash all over body  . Dizziness and weakness   Immunizations Administered    Name Date Dose VIS Date Route   Pfizer COVID-19 Vaccine 08/13/2020  1:51 PM 0.3 mL 06/13/2020 Intramuscular   Manufacturer: [employer], [employer]   Lot: [id number]   NDC: [id number]
